# Patient Record
Sex: Female | Born: 1965 | Race: Black or African American | Hispanic: No | Smoking: Never smoker
Health system: Southern US, Community
[De-identification: ages and names within clinical notes are randomized; demographics above are authoritative.]

## PROBLEM LIST (undated history)

## (undated) DIAGNOSIS — B9689 Other specified bacterial agents as the cause of diseases classified elsewhere: Secondary | ICD-10-CM

## (undated) DIAGNOSIS — K5900 Constipation, unspecified: Secondary | ICD-10-CM

## (undated) DIAGNOSIS — N760 Acute vaginitis: Secondary | ICD-10-CM

## (undated) DIAGNOSIS — D219 Benign neoplasm of connective and other soft tissue, unspecified: Secondary | ICD-10-CM

## (undated) DIAGNOSIS — L732 Hidradenitis suppurativa: Secondary | ICD-10-CM

## (undated) DIAGNOSIS — N946 Dysmenorrhea, unspecified: Secondary | ICD-10-CM

## (undated) DIAGNOSIS — K649 Unspecified hemorrhoids: Secondary | ICD-10-CM

## (undated) HISTORY — PX: BREAST BIOPSY: SHX20

## (undated) HISTORY — DX: Unspecified hemorrhoids: K64.9

## (undated) HISTORY — DX: Dysmenorrhea, unspecified: N94.6

## (undated) HISTORY — DX: Benign neoplasm of connective and other soft tissue, unspecified: D21.9

## (undated) HISTORY — DX: Hidradenitis suppurativa: L73.2

## (undated) HISTORY — PX: EXTERNAL EAR SURGERY: SHX627

## (undated) HISTORY — DX: Other specified bacterial agents as the cause of diseases classified elsewhere: N76.0

## (undated) HISTORY — DX: Constipation, unspecified: K59.00

## (undated) HISTORY — DX: Other specified bacterial agents as the cause of diseases classified elsewhere: B96.89

---

## 1999-02-16 HISTORY — PX: TUBAL LIGATION: SHX77

## 2002-02-15 HISTORY — PX: OTHER SURGICAL HISTORY: SHX169

## 2004-12-25 ENCOUNTER — Emergency Department: Payer: Self-pay | Admitting: Emergency Medicine

## 2005-08-25 ENCOUNTER — Ambulatory Visit: Payer: Self-pay | Admitting: Unknown Physician Specialty

## 2006-08-30 ENCOUNTER — Ambulatory Visit: Payer: Self-pay

## 2007-11-08 ENCOUNTER — Ambulatory Visit: Payer: Self-pay

## 2008-11-13 ENCOUNTER — Ambulatory Visit: Payer: Self-pay

## 2010-02-12 ENCOUNTER — Ambulatory Visit: Payer: Self-pay | Admitting: Obstetrics & Gynecology

## 2011-02-16 HISTORY — PX: BREAST CYST ASPIRATION: SHX578

## 2011-03-25 ENCOUNTER — Ambulatory Visit: Payer: Self-pay

## 2012-04-26 ENCOUNTER — Ambulatory Visit: Payer: Self-pay | Admitting: Family Medicine

## 2013-03-28 ENCOUNTER — Encounter: Payer: Self-pay | Admitting: Family Medicine

## 2013-04-15 ENCOUNTER — Encounter: Payer: Self-pay | Admitting: Family Medicine

## 2013-05-02 ENCOUNTER — Ambulatory Visit: Payer: Self-pay | Admitting: Family Medicine

## 2013-11-14 LAB — BASIC METABOLIC PANEL: Glucose: 120 mg/dL

## 2013-11-14 LAB — HEMOGLOBIN A1C: Hgb A1c MFr Bld: 5.8 % (ref 4.0–6.0)

## 2014-04-16 HISTORY — PX: COLONOSCOPY: SHX174

## 2014-04-26 ENCOUNTER — Ambulatory Visit: Payer: Self-pay | Admitting: Gastroenterology

## 2014-05-10 ENCOUNTER — Ambulatory Visit: Payer: Self-pay | Admitting: Nurse Practitioner

## 2014-07-18 DIAGNOSIS — R079 Chest pain, unspecified: Secondary | ICD-10-CM | POA: Insufficient documentation

## 2014-07-18 DIAGNOSIS — R519 Headache, unspecified: Secondary | ICD-10-CM | POA: Insufficient documentation

## 2014-07-18 DIAGNOSIS — E559 Vitamin D deficiency, unspecified: Secondary | ICD-10-CM | POA: Insufficient documentation

## 2014-07-18 DIAGNOSIS — R0789 Other chest pain: Secondary | ICD-10-CM | POA: Insufficient documentation

## 2014-07-18 DIAGNOSIS — R51 Headache: Secondary | ICD-10-CM

## 2014-07-18 DIAGNOSIS — Z1331 Encounter for screening for depression: Secondary | ICD-10-CM | POA: Insufficient documentation

## 2014-07-18 DIAGNOSIS — IMO0002 Reserved for concepts with insufficient information to code with codable children: Secondary | ICD-10-CM | POA: Insufficient documentation

## 2014-07-18 DIAGNOSIS — E669 Obesity, unspecified: Secondary | ICD-10-CM | POA: Insufficient documentation

## 2014-07-18 DIAGNOSIS — R739 Hyperglycemia, unspecified: Secondary | ICD-10-CM | POA: Insufficient documentation

## 2014-08-12 ENCOUNTER — Encounter: Payer: Self-pay | Admitting: Family Medicine

## 2014-08-12 ENCOUNTER — Ambulatory Visit (INDEPENDENT_AMBULATORY_CARE_PROVIDER_SITE_OTHER): Payer: BC Managed Care – PPO | Admitting: Family Medicine

## 2014-08-12 VITALS — BP 120/68 | HR 88 | Temp 98.7°F | Resp 20 | Ht 64.0 in | Wt 204.0 lb

## 2014-08-12 DIAGNOSIS — R739 Hyperglycemia, unspecified: Secondary | ICD-10-CM

## 2014-08-12 LAB — POCT GLYCOSYLATED HEMOGLOBIN (HGB A1C): Hemoglobin A1C: 5.9

## 2014-08-12 NOTE — Progress Notes (Signed)
   Subjective:    Patient ID: Sharon Brock, female    DOB: May 09, 1965, 49 y.o.   MRN: [id number]  Diabetes She presents for her follow-up diabetic visit. Her disease course has been stable. There are no hypoglycemic associated symptoms. Associated symptoms include blurred vision, foot paresthesias (left foot "burning sensation") and visual change. Pertinent negatives for diabetes include no chest pain, no fatigue, no foot ulcerations, no polydipsia, no polyphagia, no polyuria, no weakness and no weight loss. Symptoms are stable. There are no diabetic complications. Current diabetic treatment includes diet. She is compliant with treatment most of the time. Her weight is stable. She participates in exercise three times a week. She does not see a podiatrist.Eye exam is current.   Lab Results  Component Value Date   HGBA1C 5.8 11/14/2013      Review of Systems  Constitutional: Negative for weight loss and fatigue.  Eyes: Positive for blurred vision.  Cardiovascular: Negative for chest pain.  Endocrine: Negative for polydipsia, polyphagia and polyuria.  Neurological: Negative for weakness.   Patient Active Problem List   Diagnosis Date Noted  . Atypical chest pain 07/18/2014  . Adult BMI 30+ 07/18/2014  . Screening for depression 07/18/2014  . Cephalalgia 07/18/2014  . Blood glucose elevated 07/18/2014  . Avitaminosis D 07/18/2014  . Allergic rhinitis 05/25/2008   History reviewed. No pertinent past medical history. Current Outpatient Prescriptions on File Prior to Visit  Medication Sig  . Vitamin D, Cholecalciferol, 1000 UNITS TABS Take 2 tablets by mouth daily.   No current facility-administered medications on file prior to visit.   No Known Allergies Past Surgical History  Procedure Laterality Date  . No past surgeries     History   Social History  . Marital Status: Married    Spouse Name: N/A  . Number of Children: 1  . Years of Education: College    Occupational History  .  [employer]   Social History Main Topics  . Smoking status: Never Smoker   . Smokeless tobacco: Never Used  . Alcohol Use: No  . Drug Use: No  . Sexual Activity: Not on file   Other Topics Concern  . Not on file   Social History Narrative   Family History  Problem Relation Age of Onset  . Diabetes Mellitus II Mother   . Diabetes Mellitus II Father   . Prostate cancer Father   . Hypertension Brother   . Diabetes Brother          Objective:   Physical Exam  Constitutional: She is oriented to person, place, and time. She appears well-developed and well-nourished.  Musculoskeletal:  Brace on left foot.   Neurological: She is alert and oriented to person, place, and time.  Psychiatric: Her behavior is normal. Judgment and thought content normal.   Blood pressure 120/68, pulse 88, temperature 98.7 F (37.1 C), temperature source Oral, resp. rate 20, height 5\' 4"  (1.626 m), weight 204 lb (92.534 kg), last menstrual period 07/19/2014.        Assessment & Plan:   1. Blood glucose elevated Stable. Recheck in  6 months.   - POCT glycosylated hemoglobin (Hb A1C)

## 2014-10-22 ENCOUNTER — Ambulatory Visit: Payer: BC Managed Care – PPO | Admitting: Family Medicine

## 2014-10-25 ENCOUNTER — Ambulatory Visit (INDEPENDENT_AMBULATORY_CARE_PROVIDER_SITE_OTHER): Payer: BC Managed Care – PPO | Admitting: Family Medicine

## 2014-10-25 ENCOUNTER — Encounter: Payer: Self-pay | Admitting: Family Medicine

## 2014-10-25 VITALS — BP 112/76 | HR 92 | Temp 98.2°F | Resp 16 | Ht 63.5 in | Wt 203.0 lb

## 2014-10-25 DIAGNOSIS — R739 Hyperglycemia, unspecified: Secondary | ICD-10-CM | POA: Diagnosis not present

## 2014-10-25 DIAGNOSIS — R202 Paresthesia of skin: Secondary | ICD-10-CM

## 2014-10-25 DIAGNOSIS — G5750 Tarsal tunnel syndrome, unspecified lower limb: Secondary | ICD-10-CM | POA: Diagnosis not present

## 2014-10-25 MED ORDER — MELOXICAM 15 MG PO TABS: 15.0000 mg | ORAL_TABLET | Freq: Every day | ORAL | Status: DC

## 2014-10-25 NOTE — Progress Notes (Signed)
Subjective:    Patient ID: Sharon Brock, female    DOB: [birth year], 49 y.o.   MRN: [id number]  Foot Pain This is a new (Bilateral) problem. The current episode started more than 1 month ago (x 6 months). The problem occurs daily. The problem has been gradually worsening. Pertinent negatives include no abdominal pain, anorexia, arthralgias, change in bowel habit, chest pain, chills, congestion, coughing, diaphoresis, fatigue, fever, headaches, joint swelling, myalgias, nausea, neck pain, numbness, rash, sore throat, swollen glands, urinary symptoms, vertigo, visual change, vomiting or weakness. The symptoms are aggravated by walking (Pt reports the pain is only present when she walks "a long time in flat, flat shoes").  Pt states the pain is a burning sensation, "like I'm walking on coals". Pt has done research, and believes this to be tarsal tunnel syndrome. Has not tried any anti-inflammatories.  Not worse in the bed.   No diabetes.  Comes and goes.     Review of Systems  Constitutional: Negative for fever, chills, diaphoresis and fatigue.  HENT: Negative for congestion and sore throat.   Respiratory: Negative for cough.   Cardiovascular: Negative for chest pain.  Gastrointestinal: Negative for nausea, vomiting, abdominal pain, anorexia and change in bowel habit.  Musculoskeletal: Negative for myalgias, joint swelling, arthralgias and neck pain.  Skin: Negative for rash.  Neurological: Negative for vertigo, weakness, numbness and headaches.   BP 112/76 mmHg  Pulse 92  Temp(Src) 98.2 F (36.8 C) (Oral)  Resp 16  Ht 5' 3.5" (1.613 m)  Wt 203 lb (92.08 kg)  BMI 35.39 kg/m2  LMP 06/24/2014 (Approximate)   Patient Active Problem List   Diagnosis Date Noted  . Atypical chest pain 07/18/2014  . Adult BMI 30+ 07/18/2014  . Screening for depression 07/18/2014  . Cephalalgia 07/18/2014  . Blood glucose elevated 07/18/2014  . Avitaminosis D 07/18/2014  . Allergic rhinitis  05/25/2008   No past medical history on file. Current Outpatient Prescriptions on File Prior to Visit  Medication Sig  . Vitamin D, Cholecalciferol, 1000 UNITS TABS Take 2 tablets by mouth daily.  Marland Kitchen ibuprofen (ADVIL,MOTRIN) 800 MG tablet Take 800 mg by mouth.   No current facility-administered medications on file prior to visit.   No Known Allergies Past Surgical History  Procedure Laterality Date  . No past surgeries     Social History   Social History  . Marital Status: Married    Spouse Name: N/A  . Number of Children: 1  . Years of Education: College   Occupational History  .  [employer]   Social History Main Topics  . Smoking status: Never Smoker   . Smokeless tobacco: Never Used  . Alcohol Use: No  . Drug Use: No  . Sexual Activity: Not on file   Other Topics Concern  . Not on file   Social History Narrative   Family History  Problem Relation Age of Onset  . Diabetes Mellitus II Mother   . Diabetes Mellitus II Father   . Prostate cancer Father   . Hypertension Brother   . Diabetes Brother       Objective:   Physical Exam  Constitutional: She is oriented to person, place, and time. She appears well-developed and well-nourished.  Musculoskeletal: Normal range of motion. She exhibits no edema or tenderness.  Foot exam normal.   Neurological: She is alert and oriented to person, place, and time.  Monofilament normal.   Psychiatric: She has a normal mood and affect.  BP 112/76 mmHg  Pulse 92  Temp(Src) 98.2 F (36.8 C) (Oral)  Resp 16  Ht 5' 3.5" (1.613 m)  Wt 203 lb (92.08 kg)  BMI 35.39 kg/m2  LMP 06/24/2014 (Approximate)       Assessment & Plan:  1. Paresthesia Will refer to Podiatry, try anti-inflammatory, and check labs.  - Ambulatory referral to Podiatry - TSH - Hemoglobin A1c - Vitamin B12  2. Tarsal tunnel syndrome, unspecified laterality Suspect cause of pain. Will refer.  - Ambulatory referral to Podiatry - meloxicam (MOBIC) 15 MG  tablet; Take 1 tablet (15 mg total) by mouth daily.  Dispense: 30 tablet; Refill: 0 - TSH - Vitamin B12  3. Hyperglycemia Will check labs. Follow up for CPE.  Margarita Rana, MD

## 2014-11-05 ENCOUNTER — Telehealth: Payer: Self-pay

## 2014-11-05 LAB — VITAMIN B12: VITAMIN B 12: 598 pg/mL (ref 211–946)

## 2014-11-05 LAB — HEMOGLOBIN A1C
ESTIMATED AVERAGE GLUCOSE: 131 mg/dL
Hgb A1c MFr Bld: 6.2 % — ABNORMAL HIGH (ref 4.8–5.6)

## 2014-11-05 LAB — TSH: TSH: 2.37 u[IU]/mL (ref 0.450–4.500)

## 2014-11-05 NOTE — Telephone Encounter (Signed)
LMTCB 11/05/2014  Thanks,   -Mickel Baas

## 2014-11-05 NOTE — Telephone Encounter (Signed)
-----   Message from Margarita Rana, MD sent at 11/05/2014  7:35 AM EDT ----- Labs stable. Blood sugar in pre-diabetic range.  Average blood sugar is 131.  Needs to eat healthy and exercise and recheck in 4 months. Thanks.

## 2014-11-05 NOTE — Telephone Encounter (Signed)
Advised pt of lab results. Pt verbally acknowledges understanding. Emily Drozdowski, CMA   

## 2014-11-22 ENCOUNTER — Other Ambulatory Visit: Payer: Self-pay | Admitting: Family Medicine

## 2014-11-22 DIAGNOSIS — G5750 Tarsal tunnel syndrome, unspecified lower limb: Secondary | ICD-10-CM | POA: Insufficient documentation

## 2014-11-28 ENCOUNTER — Other Ambulatory Visit: Payer: Self-pay | Admitting: Family Medicine

## 2014-11-28 DIAGNOSIS — G5750 Tarsal tunnel syndrome, unspecified lower limb: Secondary | ICD-10-CM

## 2014-11-28 MED ORDER — MELOXICAM 15 MG PO TABS: 15.0000 mg | ORAL_TABLET | Freq: Every day | ORAL | Status: DC

## 2015-02-06 ENCOUNTER — Ambulatory Visit: Payer: BC Managed Care – PPO | Admitting: Family Medicine

## 2015-02-06 ENCOUNTER — Encounter: Payer: Self-pay | Admitting: Family Medicine

## 2015-02-06 ENCOUNTER — Ambulatory Visit (INDEPENDENT_AMBULATORY_CARE_PROVIDER_SITE_OTHER): Payer: BC Managed Care – PPO | Admitting: Family Medicine

## 2015-02-06 VITALS — BP 112/78 | HR 84 | Temp 97.8°F | Resp 16 | Wt 203.0 lb

## 2015-02-06 DIAGNOSIS — R739 Hyperglycemia, unspecified: Secondary | ICD-10-CM | POA: Diagnosis not present

## 2015-02-06 LAB — POCT GLYCOSYLATED HEMOGLOBIN (HGB A1C)
Est. average glucose Bld gHb Est-mCnc: 123
Hemoglobin A1C: 5.9

## 2015-02-06 NOTE — Progress Notes (Signed)
Subjective:    Patient ID: Sharon Brock, female    DOB: [birth year], 49 y.o.   MRN: [id number]  Hyperglycemia This is a chronic (FU. Last A1C was 11/04/2014 and was 6.2%) problem. Associated symptoms include abdominal pain (due to possible stomach virus), headaches and nausea. Pertinent negatives include no anorexia, arthralgias, change in bowel habit, chest pain, chills, congestion, coughing, diaphoresis, fatigue, fever, neck pain, numbness, urinary symptoms or visual change. She has tried nothing for the symptoms.   Has been cutting back and exercising some.      Review of Systems  Constitutional: Negative for fever, chills, diaphoresis and fatigue.  HENT: Negative for congestion.   Respiratory: Negative for cough.   Cardiovascular: Negative for chest pain.  Gastrointestinal: Positive for nausea and abdominal pain (due to possible stomach virus). Negative for anorexia and change in bowel habit.  Musculoskeletal: Negative for arthralgias and neck pain.  Neurological: Positive for headaches. Negative for numbness.   BP 112/78 mmHg  Pulse 84  Temp(Src) 97.8 F (36.6 C) (Oral)  Resp 16  Wt 203 lb (92.08 kg)   Patient Active Problem List   Diagnosis Date Noted  . Tarsal tunnel syndrome 11/22/2014  . Atypical chest pain 07/18/2014  . Adult BMI 30+ 07/18/2014  . Screening for depression 07/18/2014  . Cephalalgia 07/18/2014  . Blood glucose elevated 07/18/2014  . Avitaminosis D 07/18/2014  . Allergic rhinitis 05/25/2008   No past medical history on file. Current Outpatient Prescriptions on File Prior to Visit  Medication Sig  . meloxicam (MOBIC) 15 MG tablet Take 1 tablet (15 mg total) by mouth daily.  . Multiple Vitamins-Minerals (MULTIVITAMIN ADULT PO) Take by mouth.  . Vitamin D, Cholecalciferol, 1000 UNITS TABS Take 2 tablets by mouth daily.   No current facility-administered medications on file prior to visit.   No Known Allergies Past Surgical History    Procedure Laterality Date  . No past surgeries     Social History   Social History  . Marital Status: Married    Spouse Name: N/A  . Number of Children: 1  . Years of Education: College   Occupational History  .  [employer]   Social History Main Topics  . Smoking status: Never Smoker   . Smokeless tobacco: Never Used  . Alcohol Use: No  . Drug Use: No  . Sexual Activity: Not on file   Other Topics Concern  . Not on file   Social History Narrative   Family History  Problem Relation Age of Onset  . Diabetes Mellitus II Mother   . Diabetes Mellitus II Father   . Prostate cancer Father   . Hypertension Brother   . Diabetes Brother       Objective:   Physical Exam  Constitutional: She is oriented to person, place, and time. She appears well-developed and well-nourished.  Neurological: She is alert and oriented to person, place, and time.  Psychiatric: She has a normal mood and affect. Her behavior is normal. Judgment and thought content normal.    BP 112/78 mmHg  Pulse 84  Temp(Src) 97.8 F (36.6 C) (Oral)  Resp 16  Wt 203 lb (92.08 kg)       Assessment & Plan:  1. Blood glucose elevated Improved. Continue current lifestyle changes and recheck at CPE.  - POCT glycosylated hemoglobin (Hb A1C)   Patient was seen and examined by Jerrell Belfast, MD, and note scribed by Renaldo Fiddler, CMA. I have reviewed the document for accuracy  and completeness and I agree with above. Jerrell Belfast, MD  Margarita Rana, MD

## 2015-03-18 ENCOUNTER — Other Ambulatory Visit: Payer: Self-pay

## 2015-03-18 DIAGNOSIS — G5750 Tarsal tunnel syndrome, unspecified lower limb: Secondary | ICD-10-CM

## 2015-03-18 MED ORDER — MELOXICAM 15 MG PO TABS: 15.0000 mg | ORAL_TABLET | Freq: Every day | ORAL | Status: DC

## 2015-04-16 ENCOUNTER — Encounter: Payer: Self-pay | Admitting: Family Medicine

## 2015-04-16 ENCOUNTER — Ambulatory Visit (INDEPENDENT_AMBULATORY_CARE_PROVIDER_SITE_OTHER): Payer: BC Managed Care – PPO | Admitting: Family Medicine

## 2015-04-16 VITALS — BP 106/70 | HR 64 | Temp 97.8°F | Resp 16 | Ht 63.0 in | Wt 201.0 lb

## 2015-04-16 DIAGNOSIS — Z1239 Encounter for other screening for malignant neoplasm of breast: Secondary | ICD-10-CM

## 2015-04-16 DIAGNOSIS — Z0000 Encounter for general adult medical examination without abnormal findings: Secondary | ICD-10-CM | POA: Diagnosis not present

## 2015-04-16 DIAGNOSIS — R739 Hyperglycemia, unspecified: Secondary | ICD-10-CM | POA: Diagnosis not present

## 2015-04-16 DIAGNOSIS — E559 Vitamin D deficiency, unspecified: Secondary | ICD-10-CM | POA: Diagnosis not present

## 2015-04-16 NOTE — Progress Notes (Signed)
Patient ID: [name], female   DOB: 1965-08-03, 50 y.o.   MRN: [id number]       Patient: Sharon Brock, Female    DOB: 29-Jan-1966, 50 y.o.   MRN: [id number] Visit Date: 04/16/2015  Today's Provider: Margarita Rana, MD   Chief Complaint  Patient presents with  . Annual Exam   Subjective:    Annual physical exam Sharon Brock is a 50 y.o. female who presents today for health maintenance and complete physical. She feels well. She reports exercising walking for 15-20 minutes 2 times a week. She reports she is sleeping well.  2016    Pap-normal per pt (GYN) 05/10/14 Mammogram-BI-RADS 1 04/26/14 Colonoscopy-polyps, hyperplastic, recheck in 10 yrs  Lab Results  Component Value Date   TSH 2.370 11/04/2014   HGBA1C 5.9 02/06/2015   04/06/2013 Lipid-Total 186; tri 89; HDL 46; LDL 122 04/06/2013 Met C-normal 11/14/2013 Vitamin D 24.9  -----------------------------------------------------------------   Review of Systems  Constitutional: Negative.   HENT: Negative.   Eyes: Negative.   Respiratory: Negative.   Cardiovascular: Negative.   Gastrointestinal: Positive for constipation.  Endocrine: Negative.   Genitourinary: Negative.   Musculoskeletal: Negative.   Skin: Negative.   Allergic/Immunologic: Negative.   Neurological: Negative.   Hematological: Negative.   Psychiatric/Behavioral: Negative.     Social History      She  reports that she has never smoked. She has never used smokeless tobacco. She reports that she does not drink alcohol or use illicit drugs.       Social History   Social History  . Marital Status: Married    Spouse Name: N/A  . Number of Children: 1  . Years of Education: College   Occupational History  .  [employer]   Social History Main Topics  . Smoking status: Never Smoker   . Smokeless tobacco: Never Used  . Alcohol Use: No  . Drug Use: No  . Sexual Activity: Not Asked   Other Topics Concern  . None   Social History  Narrative    History reviewed. No pertinent past medical history.   Patient Active Problem List   Diagnosis Date Noted  . Tarsal tunnel syndrome 11/22/2014  . Adult BMI 30+ 07/18/2014  . Screening for depression 07/18/2014  . Cephalalgia 07/18/2014  . Blood glucose elevated 07/18/2014  . Avitaminosis D 07/18/2014  . Adiposity 07/18/2014  . Allergic rhinitis 05/25/2008    Past Surgical History  Procedure Laterality Date  . Tubal ligation  2001  . Hydronitous Bilateral 2004    inner thigh and buttocks    Family History        Family Status  Relation Status Death Age  . Mother Alive   . Father Deceased 35  . Brother Alive   . Brother Alive   . Brother Alive         Her family history includes Diabetes in her brother; Diabetes Mellitus II in her father and mother; Hypertension in her brother; Prostate cancer in her father.    No Known Allergies  Previous Medications   MULTIPLE VITAMINS-MINERALS (MULTIVITAMIN ADULT PO)    Take by mouth.   VITAMIN D, CHOLECALCIFEROL, 1000 UNITS TABS    Take 1 tablet by mouth daily.     Patient Care Team: Margarita Rana, MD as PCP - General (Family Medicine)     Objective:   Vitals: BP 106/70 mmHg  Pulse 64  Temp(Src) 97.8 F (36.6 C) (Oral)  Resp 16  Ht 5'  3" (1.6 m)  Wt 201 lb (91.173 kg)  BMI 35.61 kg/m2  SpO2 98%   Physical Exam  Constitutional: She is oriented to person, place, and time. She appears well-developed and well-nourished.  HENT:  Head: Normocephalic and atraumatic.  Right Ear: Tympanic membrane, external ear and ear canal normal.  Left Ear: Tympanic membrane, external ear and ear canal normal.  Nose: Nose normal.  Mouth/Throat: Uvula is midline, oropharynx is clear and moist and mucous membranes are normal.  Eyes: Conjunctivae, EOM and lids are normal. Pupils are equal, round, and reactive to light.  Neck: Trachea normal and normal range of motion. Neck supple. Carotid bruit is not present. No thyroid mass  and no thyromegaly present.  Cardiovascular: Normal rate, regular rhythm and normal heart sounds.   Pulmonary/Chest: Effort normal and breath sounds normal.  Abdominal: Soft. Normal appearance and bowel sounds are normal. There is no hepatosplenomegaly. There is no tenderness.  Musculoskeletal: Normal range of motion.  Lymphadenopathy:    She has no cervical adenopathy.    She has no axillary adenopathy.  Neurological: She is alert and oriented to person, place, and time. She has normal strength. No cranial nerve deficit.  Skin: Skin is warm, dry and intact.  Psychiatric: She has a normal mood and affect. Her speech is normal and behavior is normal. Judgment and thought content normal. Cognition and memory are normal.     Depression Screen PHQ 2/9 Scores 04/16/2015  PHQ - 2 Score 0      Assessment & Plan:     Routine Health Maintenance and Physical Exam  Exercise Activities and Dietary recommendations Goals    None        1. Annual physical exam Stable. Patient advised to continue eating healthy and exercise daily. Increase exercise. Weight loss to prevent diabetes.    2. Avitaminosis D - VITAMIN D 25 Hydroxy (Vit-D Deficiency, Fractures)  3. Blood glucose elevated - Lipid Panel With LDL/HDL Ratio - Comprehensive metabolic panel     Patient seen and examined by Dr. Jerrell Belfast, and note scribed by Philbert Riser. Dimas, CMA.  I have reviewed the document for accuracy and completeness and I agree with above. Jerrell Belfast, MD   Margarita Rana, MD   --------------------------------------------------------------------

## 2015-04-17 ENCOUNTER — Telehealth: Payer: Self-pay

## 2015-04-17 LAB — COMPREHENSIVE METABOLIC PANEL
A/G RATIO: 1.3 (ref 1.1–2.5)
ALBUMIN: 4.3 g/dL (ref 3.5–5.5)
ALK PHOS: 80 IU/L (ref 39–117)
ALT: 14 IU/L (ref 0–32)
AST: 14 IU/L (ref 0–40)
BUN / CREAT RATIO: 15 (ref 9–23)
BUN: 12 mg/dL (ref 6–24)
Bilirubin Total: 0.4 mg/dL (ref 0.0–1.2)
CO2: 25 mmol/L (ref 18–29)
CREATININE: 0.8 mg/dL (ref 0.57–1.00)
Calcium: 9.3 mg/dL (ref 8.7–10.2)
Chloride: 100 mmol/L (ref 96–106)
GFR calc Af Amer: 100 mL/min/{1.73_m2} (ref >59)
GFR calc non Af Amer: 87 mL/min/{1.73_m2} (ref >59)
GLOBULIN, TOTAL: 3.3 g/dL (ref 1.5–4.5)
Glucose: 100 mg/dL — ABNORMAL HIGH (ref 65–99)
POTASSIUM: 4.6 mmol/L (ref 3.5–5.2)
SODIUM: 140 mmol/L (ref 134–144)
Total Protein: 7.6 g/dL (ref 6.0–8.5)

## 2015-04-17 LAB — LIPID PANEL WITH LDL/HDL RATIO
CHOLESTEROL TOTAL: 180 mg/dL (ref 100–199)
HDL: 49 mg/dL (ref >39)
LDL CALC: 107 mg/dL — AB (ref 0–99)
LDL/HDL RATIO: 2.2 ratio (ref 0.0–3.2)
TRIGLYCERIDES: 121 mg/dL (ref 0–149)
VLDL CHOLESTEROL CAL: 24 mg/dL (ref 5–40)

## 2015-04-17 LAB — VITAMIN D 25 HYDROXY (VIT D DEFICIENCY, FRACTURES): Vit D, 25-Hydroxy: 35 ng/mL (ref 30.0–100.0)

## 2015-04-17 NOTE — Telephone Encounter (Signed)
-----   Message from Margarita Rana, MD sent at 04/17/2015  8:08 AM EST ----- Labs stable. Please notify patient. Thanks.

## 2015-04-17 NOTE — Telephone Encounter (Signed)
Advised pt of lab results. Pt verbally acknowledges understanding. Saaya Procell Drozdowski, CMA   

## 2015-05-30 ENCOUNTER — Ambulatory Visit
Admission: RE | Admit: 2015-05-30 | Discharge: 2015-05-30 | Disposition: A | Discharge status: Home / Self Care | Payer: BC Managed Care – PPO | Source: Ambulatory Visit | Attending: Family Medicine | Admitting: Family Medicine

## 2015-05-30 DIAGNOSIS — Z1231 Encounter for screening mammogram for malignant neoplasm of breast: Secondary | ICD-10-CM | POA: Diagnosis present

## 2015-05-30 DIAGNOSIS — Z1239 Encounter for other screening for malignant neoplasm of breast: Secondary | ICD-10-CM

## 2015-07-21 ENCOUNTER — Encounter: Payer: Self-pay | Admitting: *Deleted

## 2015-07-21 ENCOUNTER — Telehealth: Payer: Self-pay

## 2015-07-21 DIAGNOSIS — K649 Unspecified hemorrhoids: Secondary | ICD-10-CM

## 2015-07-21 NOTE — Telephone Encounter (Signed)
Pt called c/o hematochezia. Has been experiencing bright blood intermittent stools x several months. Last colonoscopy was 04/26/2014. 3 hyperplastic polyps, as well as non-bleeding internal hemorrhoids were found. Per verbal order from Dr. Venia Minks, referred pt to surgery. Canceled appointment for bloody stools. Pt agrees with treatment plan. Renaldo Fiddler, CMA

## 2015-07-30 ENCOUNTER — Ambulatory Visit (INDEPENDENT_AMBULATORY_CARE_PROVIDER_SITE_OTHER): Payer: BC Managed Care – PPO | Admitting: General Surgery

## 2015-07-30 ENCOUNTER — Encounter: Payer: Self-pay | Admitting: General Surgery

## 2015-07-30 VITALS — BP 130/72 | HR 76 | Resp 12 | Ht 66.0 in | Wt 200.0 lb

## 2015-07-30 DIAGNOSIS — K648 Other hemorrhoids: Secondary | ICD-10-CM | POA: Diagnosis not present

## 2015-07-30 DIAGNOSIS — K625 Hemorrhage of anus and rectum: Secondary | ICD-10-CM | POA: Diagnosis not present

## 2015-07-30 LAB — POC HEMOCCULT BLD/STL (OFFICE/1-CARD/DIAGNOSTIC): Fecal Occult Blood, POC: NEGATIVE

## 2015-07-30 NOTE — Patient Instructions (Addendum)
The patient will be encouraged to make use of a daily fiber supplement.Patient to return as needed.

## 2015-07-30 NOTE — Progress Notes (Signed)
Patient ID: Sharon Brock, female   DOB: 10-31-1965, 50 y.o.   MRN: [id number]  Chief Complaint  Patient presents with  . Other    hemorrhoids    HPI Sharon Piccoloamela Lynn Gertsch is a 50 y.o. female here today for a evaluation of hemorrhoids. Patient states she has had them for about two years. She states she had a colonoscopy in 2016 for rectal bleeding. Patient states she has been bleeding off and on for over a year now. Bright red blood on the paper and in the bowl. No pain.  Moves her bowels every three or four days. Stools are typically hard.  I personally reviewed the patient's history. HPI  History reviewed. No pertinent past medical history.  Past Surgical History  Procedure Laterality Date  . Tubal ligation  2001  . Hydronitous Bilateral 2004    inner thigh and buttocks  . Breast cyst aspiration Right 2013    NEG  . Colonoscopy  04/2014    Family History  Problem Relation Age of Onset  . Diabetes Mellitus II Mother   . Diabetes Mellitus II Father   . Prostate cancer Father   . Hypertension Brother   . Diabetes Brother   . Breast cancer Neg Hx     Social History Social History  Substance Use Topics  . Smoking status: Never Smoker   . Smokeless tobacco: Never Used  . Alcohol Use: No    No Known Allergies  Current Outpatient Prescriptions  Medication Sig Dispense Refill  . dicloxacillin (DYNAPEN) 500 MG capsule     . meloxicam (MOBIC) 15 MG tablet Take 15 mg by mouth daily.    . Multiple Vitamins-Minerals (MULTIVITAMIN ADULT PO) Take by mouth.     No current facility-administered medications for this visit.    Review of Systems Review of Systems  Constitutional: Negative.   Respiratory: Negative.   Cardiovascular: Negative.   Gastrointestinal: Positive for constipation.    Blood pressure 130/72, pulse 76, resp. rate 12, height 5\' 6"  (1.676 m), weight 200 lb (90.719 kg).  Physical Exam Physical Exam  Constitutional: She is oriented to person, place,  and time. She appears well-developed and well-nourished.  Eyes: Conjunctivae are normal. No scleral icterus.  Neck: Neck supple.  Cardiovascular: Normal rate, regular rhythm and normal heart sounds.   Pulmonary/Chest: Effort normal and breath sounds normal.  Genitourinary: Rectal exam shows internal hemorrhoid and fissure.  Lymphadenopathy:    She has no cervical adenopathy.  Neurological: She is alert and oriented to person, place, and time.  Skin: Skin is warm and dry.    Data Reviewed Colonoscopy dated 04/26/2014 completed for hematochezia was performed by Dr. Servando SnareWohl. Report revealed perianal and digital rectal exams were normal. Retroflexed view of the anus did show internal hemorrhoids. 3 small polyps of the sigmoid colon. Review of the hospital database had no pathology attached to this procedure.  Anoscopy showed some prominent internal tissue anteriorly on the left us associated with a 5 x 8 mm a sensate squamous nodule likely secondary to chronic prolapse as well as a less prominent internal pile posteriorly.  Assessment    Rectal bleeding secondary to moderate internal hemorrhoids and ongoing constipation.    Plan    The importance of maintaining soft easy illumination was reviewed.   The patient will be encouraged to make use of a daily fiber supplement of choice for the next month with adequate water for effective use.  The patient's been asked to give a phone report  in 1 month. If her symptoms persist we'll reassess for the possibility of hemorrhoid banding..  Patient to return as  needed  PCP:  Venia Minks This information has been scribed by Gaspar Cola CMA.    Robert Bellow 07/31/2015, 4:12 PM

## 2015-07-31 ENCOUNTER — Ambulatory Visit: Payer: BC Managed Care – PPO | Admitting: Family Medicine

## 2015-07-31 ENCOUNTER — Encounter: Payer: Self-pay | Admitting: General Surgery

## 2015-07-31 DIAGNOSIS — K625 Hemorrhage of anus and rectum: Secondary | ICD-10-CM | POA: Insufficient documentation

## 2015-07-31 DIAGNOSIS — K648 Other hemorrhoids: Secondary | ICD-10-CM | POA: Insufficient documentation

## 2015-10-13 ENCOUNTER — Ambulatory Visit: Payer: BC Managed Care – PPO | Admitting: General Surgery

## 2015-11-03 ENCOUNTER — Ambulatory Visit: Payer: BC Managed Care – PPO | Admitting: General Surgery

## 2015-12-01 ENCOUNTER — Ambulatory Visit: Payer: BC Managed Care – PPO | Admitting: General Surgery

## 2016-02-12 ENCOUNTER — Encounter: Payer: Self-pay | Admitting: *Deleted

## 2016-03-02 ENCOUNTER — Encounter: Payer: Self-pay | Admitting: *Deleted

## 2016-03-08 ENCOUNTER — Ambulatory Visit (INDEPENDENT_AMBULATORY_CARE_PROVIDER_SITE_OTHER): Payer: BC Managed Care – PPO | Admitting: General Surgery

## 2016-03-08 ENCOUNTER — Encounter: Payer: Self-pay | Admitting: General Surgery

## 2016-03-08 VITALS — BP 134/66 | HR 68 | Resp 12 | Ht 64.0 in | Wt 199.0 lb

## 2016-03-08 DIAGNOSIS — K648 Other hemorrhoids: Secondary | ICD-10-CM | POA: Insufficient documentation

## 2016-03-08 NOTE — Patient Instructions (Addendum)
Return as needed.Continue fiber supplement.

## 2016-03-08 NOTE — Progress Notes (Signed)
Patient ID: Sharon Brock, female   DOB: [birth year], 51 y.o.   MRN: [id number]  Chief Complaint  Patient presents with  . Hemorrhoids    HPI Sharon Brock is a 51 y.o. female is here today for blood in stool and hemorrhoids. Patient was last seen in June 2017. Sharon Brock states Sharon Brock still has bleeding maybe once a month. Sharon Brock has been taking a fiber supplement which has been helping.   If the patient is going to have an episode of bleeding it is associated with a hard stool no pain with defecation. HPI  No past medical history on file.  Past Surgical History:  Procedure Laterality Date  . BREAST CYST ASPIRATION Right 2013   NEG  . COLONOSCOPY  04/2014  . hydronitous Bilateral 2004   inner thigh and buttocks  . TUBAL LIGATION  2001    Family History  Problem Relation Age of Onset  . Diabetes Mellitus II Mother   . Diabetes Mellitus II Father   . Prostate cancer Father   . Hypertension Brother   . Diabetes Brother   . Breast cancer Neg Hx     Social History Social History  Substance Use Topics  . Smoking status: Never Smoker  . Smokeless tobacco: Never Used  . Alcohol use No    No Known Allergies  Current Outpatient Prescriptions  Medication Sig Dispense Refill  . Corn Dextrin (EQL FIBER SUPPLEMENT PO) Take by mouth.    Marland Kitchen HUMIRA 40 MG/0.8ML PSKT     . meloxicam (MOBIC) 15 MG tablet Take 15 mg by mouth as needed.     . Multiple Vitamins-Minerals (MULTIVITAMIN ADULT PO) Take by mouth.     No current facility-administered medications for this visit.     Review of Systems Review of Systems  Constitutional: Negative.   Respiratory: Negative.   Cardiovascular: Negative.   Gastrointestinal: Negative.     Blood pressure 134/66, pulse 68, resp. rate 12, height 5\' 4"  (1.626 m), weight 199 lb (90.3 kg).  Physical Exam Physical Exam  Constitutional: Sharon Brock is oriented to person, place, and time. Sharon Brock appears well-developed and well-nourished.  Eyes: Conjunctivae are  normal. No scleral icterus.  Cardiovascular: Normal rate, regular rhythm and normal heart sounds.   Genitourinary:  Genitourinary Comments: Evidence of focal hidradenitis approximately 8 cm from the anus to the right of the midline. Extensive scarring from previous wide excision in the early 2000 on the left side of the midline.  Digital rectal exam undertaken without discomfort. No masses or tenderness. No fissure or fistula.  Neurological: Sharon Brock is alert and oriented to person, place, and time.  Skin: Skin is warm and dry.    Data Reviewed No new data.  Assessment    Episodic rectal bleeding thought secondary to internal hemorrhoids, markedly improved with fiber supplements.    Plan    Patient will follow-up as needed. Should Sharon Brock develop increasing bleeding or develop pain, early reassessment we'll be appropriate.  Past efforts to retrieve biopsy results from her October 2016 colonoscopy have been unsuccessful. The endoscopist reported to-4 mm sessile polyps in the sigmoid. Repeat exam in 5 years recommended.    Return as needed.Continue fiber supplement.  This information has been scribed by Verlene Mayer, CMA.   Robert Bellow 03/08/2016, 12:51 PM

## 2016-05-25 ENCOUNTER — Encounter: Payer: Self-pay | Admitting: Obstetrics and Gynecology

## 2016-05-25 ENCOUNTER — Ambulatory Visit (INDEPENDENT_AMBULATORY_CARE_PROVIDER_SITE_OTHER): Payer: BC Managed Care – PPO | Admitting: Obstetrics and Gynecology

## 2016-05-25 VITALS — BP 116/84 | HR 77 | Ht 64.0 in | Wt 208.0 lb

## 2016-05-25 DIAGNOSIS — B373 Candidiasis of vulva and vagina: Secondary | ICD-10-CM | POA: Diagnosis not present

## 2016-05-25 DIAGNOSIS — Z1231 Encounter for screening mammogram for malignant neoplasm of breast: Secondary | ICD-10-CM

## 2016-05-25 DIAGNOSIS — B3731 Acute candidiasis of vulva and vagina: Secondary | ICD-10-CM

## 2016-05-25 DIAGNOSIS — Z1151 Encounter for screening for human papillomavirus (HPV): Secondary | ICD-10-CM

## 2016-05-25 DIAGNOSIS — Z01419 Encounter for gynecological examination (general) (routine) without abnormal findings: Secondary | ICD-10-CM

## 2016-05-25 DIAGNOSIS — N951 Menopausal and female climacteric states: Secondary | ICD-10-CM | POA: Diagnosis not present

## 2016-05-25 DIAGNOSIS — L732 Hidradenitis suppurativa: Secondary | ICD-10-CM | POA: Insufficient documentation

## 2016-05-25 DIAGNOSIS — Z1239 Encounter for other screening for malignant neoplasm of breast: Secondary | ICD-10-CM

## 2016-05-25 DIAGNOSIS — Z124 Encounter for screening for malignant neoplasm of cervix: Secondary | ICD-10-CM

## 2016-05-25 LAB — POCT WET PREP WITH KOH
Clue Cells Wet Prep HPF POC: NEGATIVE
KOH PREP POC: NEGATIVE
Trichomonas, UA: NEGATIVE
YEAST WET PREP PER HPF POC: POSITIVE

## 2016-05-25 LAB — HM PAP SMEAR

## 2016-05-25 MED ORDER — TERCONAZOLE 0.8 % VA CREA: 1.0000 | TOPICAL_CREAM | Freq: Every day | VAGINAL | 0 refills | Status: AC

## 2016-05-25 NOTE — Progress Notes (Signed)
Chief Complaint  Patient presents with  . Gynecologic Exam    discharge x few days    HPI:      [name] is a 51 y.o. No obstetric history on file. who LMP was No LMP recorded. Patient is not currently having periods (Reason: Perimenopausal)., presents today for her annual examination.  Her menses are absent but she had 5 days of light bleeding a few months ago.  Dysmenorrhea none. She does not have intermenstrual bleeding.  She complains of increased vaginal d/c, irritation, without odor for the past few days. She was on abx about a month ago.  She does not have vasomotor sx.  Sex activity: single partner, contraception - tubal ligation. She does not have vaginal dryness.  Last Pap: April 03, 2014  Results were: no abnormalities /neg HPV DNA done 1/13.  Hx of STDs: none  Last mammogram: May 30, 2015  Results were: normal--routine follow-up in 12 months There is no FH of breast cancer. There is no FH of ovarian cancer. The patient does do self-breast exams.  Colonoscopy: colonoscopy 2 years ago with polyps; repeat due (per pt) in 5-10 yrs.   Tobacco use: The patient denies current or previous tobacco use. Alcohol use: none Exercise: moderately active  She does get adequate calcium and Vitamin D in her diet.   History reviewed. No pertinent past medical history.  Past Surgical History:  Procedure Laterality Date  . BREAST CYST ASPIRATION Right 2013   NEG  . COLONOSCOPY  04/2014  . hydronitous Bilateral 2004   inner thigh and buttocks  . TUBAL LIGATION  2001    Family History  Problem Relation Age of Onset  . Diabetes Mellitus II Mother   . Diabetes Mellitus II Father   . Prostate cancer Father   . Hypertension Brother   . Diabetes Brother   . Breast cancer Neg Hx     Social History   Social History  . Marital status: Married    Spouse name: N/A  . Number of children: 1  . Years of education: College   Occupational History  .  [employer]    Social History Main Topics  . Smoking status: Never Smoker  . Smokeless tobacco: Never Used  . Alcohol use No  . Drug use: No  . Sexual activity: Yes   Other Topics Concern  . Not on file   Social History Narrative  . No narrative on file     Current Outpatient Prescriptions:  .  Corn Dextrin (EQL FIBER SUPPLEMENT PO), Take by mouth., Disp: , Rfl:  .  HUMIRA 40 MG/0.8ML PSKT, , Disp: , Rfl:  .  meloxicam (MOBIC) 15 MG tablet, Take 15 mg by mouth as needed. , Disp: , Rfl:  .  Multiple Vitamins-Minerals (MULTIVITAMIN ADULT PO), Take by mouth., Disp: , Rfl:  .  terconazole (TERAZOL 3) 0.8 % vaginal cream, Place 1 applicator vaginally at bedtime., Disp: 20 g, Rfl: 0 .  triamcinolone ointment (KENALOG) 0.1 %, , Disp: , Rfl:    ROS:  Review of Systems  Constitutional: Negative for fever, malaise/fatigue and weight loss.  HENT: Negative for congestion, ear pain and sinus pain.   Respiratory: Negative for cough, shortness of breath and wheezing.   Cardiovascular: Negative for chest pain, orthopnea and leg swelling.  Gastrointestinal: Negative for constipation, diarrhea, nausea and vomiting.  Genitourinary: Negative for dysuria, frequency, hematuria and urgency.       Breast ROS: negative  Has Increased  d/c   Musculoskeletal: Negative for back pain, joint pain and myalgias.  Skin: Negative for itching and rash.  Neurological: Negative for dizziness, tingling, focal weakness and headaches.  Endo/Heme/Allergies: Negative for environmental allergies. Does not bruise/bleed easily.  Psychiatric/Behavioral: Negative for depression and suicidal ideas. The patient is not nervous/anxious and does not have insomnia.     Objective: BP 116/84 (BP Location: Left Arm, Patient Position: Sitting, Cuff Size: Normal)   Pulse 77   Ht 5\' 4"  (1.626 m)   Wt 208 lb (94.3 kg)   BMI 35.70 kg/m    Physical Exam  Constitutional: She is oriented to person, place, and time. She appears  well-developed and well-nourished.  Genitourinary: Uterus normal. No erythema or tenderness in the vagina. Vaginal discharge found. Right adnexum does not display mass and does not display tenderness. Left adnexum does not display mass and does not display tenderness. Cervix does not exhibit motion tenderness or polyp. Uterus is not enlarged or tender.  Neck: Normal range of motion. No thyromegaly present.  Cardiovascular: Normal rate, regular rhythm and normal heart sounds.   No murmur heard. Pulmonary/Chest: Effort normal and breath sounds normal. Right breast exhibits no mass, no nipple discharge, no skin change and no tenderness. Left breast exhibits no mass, no nipple discharge, no skin change and no tenderness.  Abdominal: Soft. There is no tenderness. There is no guarding.  Musculoskeletal: Normal range of motion.  Neurological: She is alert and oriented to person, place, and time. No cranial nerve deficit.  Psychiatric: She has a normal mood and affect. Her behavior is normal.  Vitals reviewed.   Results: Results for orders placed or performed in visit on 05/25/16 (from the past 24 hour(s))  POCT Wet Prep with KOH     Status: Abnormal   Collection Time: 05/25/16  3:40 PM  Result Value Ref Range   Trichomonas, UA Negative    Clue Cells Wet Prep HPF POC neg    Epithelial Wet Prep HPF POC  Few, Moderate, Many, Too numerous to count   Yeast Wet Prep HPF POC pos    Bacteria Wet Prep HPF POC  Few   RBC Wet Prep HPF POC     WBC Wet Prep HPF POC     KOH Prep POC Negative Negative    Assessment/Plan: Encounter for annual routine gynecological examination  Cervical cancer screening - Plan: IGP, Aptima HPV  Screening for HPV (human papillomavirus) - Plan: IGP, Aptima HPV  Screening for breast cancer - Pt to sched mammo. - Plan: MM DIGITAL SCREENING BILATERAL  Candidal vaginitis - Pos wet prep. Rx terazol eRxd. F/u prn. - Plan: POCT Wet Prep with KOH, terconazole (TERAZOL 3) 0.8 %  vaginal cream  Perimenopause - F/u prn DUB.            GYN counsel mammography screening, adequate intake of calcium and vitamin D, diet and exercise     F/U  Return in about 1 year (around 05/25/2017).  Kenith Trickel B. Lorielle Boehning, PA-C 05/25/2016 4:22 PM

## 2016-05-27 LAB — IGP, APTIMA HPV
HPV Aptima: NEGATIVE
PAP Smear Comment: 0

## 2016-06-07 ENCOUNTER — Ambulatory Visit (INDEPENDENT_AMBULATORY_CARE_PROVIDER_SITE_OTHER): Payer: BC Managed Care – PPO | Admitting: Family Medicine

## 2016-06-07 ENCOUNTER — Encounter: Payer: Self-pay | Admitting: Family Medicine

## 2016-06-07 VITALS — BP 116/72 | HR 80 | Temp 97.9°F | Resp 16 | Wt 202.0 lb

## 2016-06-07 DIAGNOSIS — E6609 Other obesity due to excess calories: Secondary | ICD-10-CM | POA: Diagnosis not present

## 2016-06-07 DIAGNOSIS — R739 Hyperglycemia, unspecified: Secondary | ICD-10-CM | POA: Diagnosis not present

## 2016-06-07 DIAGNOSIS — Z6834 Body mass index (BMI) 34.0-34.9, adult: Secondary | ICD-10-CM

## 2016-06-07 DIAGNOSIS — R52 Pain, unspecified: Secondary | ICD-10-CM | POA: Diagnosis not present

## 2016-06-07 DIAGNOSIS — E7800 Pure hypercholesterolemia, unspecified: Secondary | ICD-10-CM | POA: Diagnosis not present

## 2016-06-07 DIAGNOSIS — D1723 Benign lipomatous neoplasm of skin and subcutaneous tissue of right leg: Secondary | ICD-10-CM

## 2016-06-07 DIAGNOSIS — L732 Hidradenitis suppurativa: Secondary | ICD-10-CM

## 2016-06-07 LAB — POCT GLYCOSYLATED HEMOGLOBIN (HGB A1C)
ESTIMATED AVERAGE GLUCOSE: 123
Hemoglobin A1C: 5.9

## 2016-06-07 NOTE — Progress Notes (Signed)
Patient: Sharon Brock Female    DOB: 04-23-1965   51 y.o.   MRN: [id number] Visit Date: 06/07/2016  Today's Provider: Wilhemena Durie, MD   Chief Complaint  Patient presents with  . Hyperglycemia   Subjective:    HPI      Hyperglycemia, Follow-up:   Lab Results  Component Value Date   HGBA1C 5.9 06/07/2016   HGBA1C 5.9 02/06/2015   HGBA1C 6.2 (H) 11/04/2014   GLUCOSE 100 (H) 04/16/2015    Last seen for for this over 1 year ago.  Management since then includes continuing healthy lifestyle. Current symptoms include polyuria and have been stable.  Weight trend: stable Prior visit with dietician: no Current diet: in general, an "unhealthy" diet Current exercise: walking. Pt walks around the track about 3 days per week.  Pertinent Labs:    Component Value Date/Time   CHOL 180 04/16/2015 1024   TRIG 121 04/16/2015 1024   CREATININE 0.80 04/16/2015 1024    Wt Readings from Last 3 Encounters:  06/07/16 202 lb (91.6 kg)  05/25/16 208 lb (94.3 kg)  03/08/16 199 lb (90.3 kg)   Pt is also concerned about pains she gets in different parts of her body. Pain is described as a "shock", and has been located in her abdomen and breast. The pain only lasts for 1-2 seconds, and occur once or twice weekly.  Pt has a possible cyst located on her right inner thigh.  No Known Allergies   Current Outpatient Prescriptions:  .  Corn Dextrin (EQL FIBER SUPPLEMENT PO), Take by mouth., Disp: , Rfl:  .  finasteride (PROSCAR) 5 MG tablet, Take 5 mg by mouth daily., Disp: , Rfl:  .  HUMIRA 40 MG/0.8ML PSKT, , Disp: , Rfl:  .  meloxicam (MOBIC) 15 MG tablet, Take 15 mg by mouth as needed. , Disp: , Rfl:  .  Multiple Vitamins-Minerals (MULTIVITAMIN ADULT PO), Take by mouth., Disp: , Rfl:  .  triamcinolone ointment (KENALOG) 0.1 %, , Disp: , Rfl:   Review of Systems  Constitutional: Negative for activity change, appetite change, chills, diaphoresis, fatigue, fever and  unexpected weight change.  Eyes: Negative.   Respiratory: Negative for shortness of breath.   Cardiovascular: Negative for chest pain, palpitations and leg swelling.  Endocrine: Positive for polyuria. Negative for polydipsia.  Allergic/Immunologic: Negative.   Neurological: Negative.   Hematological: Negative.   Psychiatric/Behavioral: Negative.     Social History  Substance Use Topics  . Smoking status: Never Smoker  . Smokeless tobacco: Never Used  . Alcohol use 0.6 oz/week    1 Glasses of wine per week   Objective:   BP 116/72 (BP Location: Right Arm, Patient Position: Sitting, Cuff Size: Normal)   Pulse 80   Temp 97.9 F (36.6 C) (Oral)   Resp 16   Wt 202 lb (91.6 kg)   BMI 34.67 kg/m  Vitals:   06/07/16 1603  BP: 116/72  Pulse: 80  Resp: 16  Temp: 97.9 F (36.6 C)  TempSrc: Oral  Weight: 202 lb (91.6 kg)     Physical Exam  Constitutional: She appears well-developed and well-nourished.  HENT:  Head: Normocephalic and atraumatic.  Right Ear: External ear normal.  Left Ear: External ear normal.  Nose: Nose normal.  Eyes: Conjunctivae are normal.  Neck: Normal range of motion. Neck supple. No thyromegaly present.  Cardiovascular: Normal rate, regular rhythm and normal heart sounds.   Pulmonary/Chest: Effort normal and breath  sounds normal. No respiratory distress.  Abdominal: Soft. There is no tenderness.  Musculoskeletal:  2 in by 1 in lipoma located on right inner thigh  Skin: Skin is warm and dry.  Psychiatric: She has a normal mood and affect. Her behavior is normal. Judgment and thought content normal.        Assessment & Plan:     1. Blood glucose elevated Unchanged. Work on diet. Recheck 6 months. - POCT glycosylated hemoglobin (Hb A1C) - CBC with Differential/Platelet - Comprehensive metabolic panel Results for orders placed or performed in visit on 06/07/16  POCT glycosylated hemoglobin (Hb A1C)  Result Value Ref Range   Hemoglobin A1C  5.9    Est. average glucose Bld gHb Est-mCnc 123      2. Hidradenitis suppurativa F/B dermatology. Is taking Humira and Proscar for this. Continue to FU as scheduled.  3. Class 1 obesity due to excess calories with body mass index (BMI) of 34.0 to 34.9 in adult, unspecified whether serious comorbidity present Discussed importance of healthy diet.  4. Lipoma of right lower extremity New problem.   5. Electric shock-type pain Unclear etiology. Will check routine labs. No ongoing symptoms--w/u as indicated . No apparent pathology. 6. Elevated LDL cholesterol level Pt has H/O this. FU pending results. - Lipid panel - TSH     Patient seen and examined by Miguel Aschoff, MD, and note scribed by Renaldo Fiddler, CMA. .I have done the exam and reviewed the above chart and it is accurate to the best of my knowledge. Development worker, community has been used in this note in any air is in the dictation or transcription are unintentional. .lgi  Wilhemena Durie, MD  [place]

## 2016-06-10 ENCOUNTER — Encounter: Payer: Self-pay | Admitting: Family Medicine

## 2016-06-10 LAB — BASIC METABOLIC PANEL
BUN: 9 mg/dL (ref 4–21)
CREATININE: 0.8 mg/dL (ref 0.5–1.1)
Glucose: 96 mg/dL
POTASSIUM: 4.4 mmol/L (ref 3.4–5.3)
Sodium: 143 mmol/L (ref 137–147)

## 2016-06-10 LAB — CBC AND DIFFERENTIAL
HEMATOCRIT: 44 % (ref 36–46)
HEMOGLOBIN: 14.3 g/dL (ref 12.0–16.0)
Neutrophils Absolute: 3 /uL
PLATELETS: 298 10*3/uL (ref 150–399)
WBC: 5.9 10*3/mL

## 2016-06-10 LAB — TSH: TSH: 2.22 u[IU]/mL (ref 0.41–5.90)

## 2016-06-10 LAB — LIPID PANEL
CHOLESTEROL: 167 mg/dL (ref 0–200)
HDL: 48 mg/dL (ref 35–70)
LDL Cholesterol: 105 mg/dL
TRIGLYCERIDES: 71 mg/dL (ref 40–160)

## 2016-06-11 ENCOUNTER — Encounter: Payer: Self-pay | Admitting: Family Medicine

## 2016-06-15 ENCOUNTER — Ambulatory Visit
Admission: RE | Admit: 2016-06-15 | Discharge: 2016-06-15 | Disposition: A | Discharge status: Home / Self Care | Payer: BC Managed Care – PPO | Source: Ambulatory Visit | Attending: Obstetrics and Gynecology | Admitting: Obstetrics and Gynecology

## 2016-06-15 DIAGNOSIS — Z1231 Encounter for screening mammogram for malignant neoplasm of breast: Secondary | ICD-10-CM | POA: Insufficient documentation

## 2016-06-15 DIAGNOSIS — Z1239 Encounter for other screening for malignant neoplasm of breast: Secondary | ICD-10-CM

## 2016-06-16 ENCOUNTER — Encounter: Payer: Self-pay | Admitting: Obstetrics and Gynecology

## 2016-08-30 ENCOUNTER — Telehealth: Payer: Self-pay | Admitting: Family Medicine

## 2016-08-30 NOTE — Telephone Encounter (Signed)
Faxed blood work to [facility] 08-04-16

## 2016-12-15 ENCOUNTER — Ambulatory Visit: Payer: BC Managed Care – PPO | Admitting: Family Medicine

## 2017-01-19 ENCOUNTER — Ambulatory Visit: Payer: BC Managed Care – PPO | Admitting: Family Medicine

## 2017-02-09 ENCOUNTER — Encounter: Payer: Self-pay | Admitting: Family Medicine

## 2017-02-09 ENCOUNTER — Ambulatory Visit (INDEPENDENT_AMBULATORY_CARE_PROVIDER_SITE_OTHER): Payer: BC Managed Care – PPO | Admitting: Family Medicine

## 2017-02-09 VITALS — BP 118/68 | HR 68 | Temp 97.8°F | Resp 14 | Wt 208.0 lb

## 2017-02-09 DIAGNOSIS — R739 Hyperglycemia, unspecified: Secondary | ICD-10-CM

## 2017-02-09 LAB — POCT GLYCOSYLATED HEMOGLOBIN (HGB A1C): HEMOGLOBIN A1C: 6.1

## 2017-02-09 NOTE — Progress Notes (Signed)
       Patient: Sharon Brock Female    DOB: [birth year]   51 y.o.   MRN: [id number] Visit Date: 02/09/2017  Today's Provider: Wilhemena Durie, MD   Chief Complaint  Patient presents with  . Follow-up   Subjective:    HPI Pt is here for a 6 month follow up. She reports that she has been feeling well. Her last A1c was on 06/07/16 and was 5.9 Her Mother just died a few weeks ago so holidays have been difficult.      No Known Allergies   Current Outpatient Medications:  .  Corn Dextrin (EQL FIBER SUPPLEMENT PO), Take by mouth., Disp: , Rfl:  .  HUMIRA 40 MG/0.8ML PSKT, , Disp: , Rfl:  .  Multiple Vitamins-Minerals (MULTIVITAMIN ADULT PO), Take by mouth., Disp: , Rfl:  .  finasteride (PROSCAR) 5 MG tablet, Take 5 mg by mouth daily., Disp: , Rfl:  .  meloxicam (MOBIC) 15 MG tablet, Take 15 mg by mouth as needed. , Disp: , Rfl:  .  triamcinolone ointment (KENALOG) 0.1 %, , Disp: , Rfl:   Review of Systems  Constitutional: Negative.   HENT: Negative.   Eyes: Negative.   Respiratory: Negative.   Cardiovascular: Negative.   Gastrointestinal: Negative.   Endocrine: Negative.   Genitourinary: Negative.   Musculoskeletal: Negative.   Skin: Negative.   Allergic/Immunologic: Negative.   Neurological: Negative.   Hematological: Negative.   Psychiatric/Behavioral: Negative.     Social History   Tobacco Use  . Smoking status: Never Smoker  . Smokeless tobacco: Never Used  Substance Use Topics  . Alcohol use: Yes    Alcohol/week: 0.6 oz    Types: 1 Glasses of wine per week   Objective:   BP 118/68 (BP Location: Left Arm, Patient Position: Sitting, Cuff Size: Normal)   Pulse 68   Temp 97.8 F (36.6 C) (Oral)   Resp 14   Wt 208 lb (94.3 kg)   LMP 07/19/2014 (Exact Date)   BMI 35.70 kg/m  Vitals:   02/09/17 0836  BP: 118/68  Pulse: 68  Resp: 14  Temp: 97.8 F (36.6 C)  TempSrc: Oral  Weight: 208 lb (94.3 kg)     Physical Exam  Constitutional: She is  oriented to person, place, and time. She appears well-developed and well-nourished.  HENT:  Head: Normocephalic and atraumatic.  Right Ear: External ear normal.  Left Ear: External ear normal.  Nose: Nose normal.  Eyes: Conjunctivae are normal. No scleral icterus.  Neck: No thyromegaly present.  Cardiovascular: Normal rate, regular rhythm and normal heart sounds.  Pulmonary/Chest: Effort normal.  Abdominal: Soft.  Neurological: She is alert and oriented to person, place, and time.  Skin: Skin is warm and dry.  Psychiatric: She has a normal mood and affect. Her behavior is normal. Judgment and thought content normal.        Assessment & Plan:     1. Blood glucose elevated  - POCT HgB A1C--6.1 today 2.Mild Obesity Weight loss discussed. RTC 6 months.     I have done the exam and reviewed the above chart and it is accurate to the best of my knowledge. Development worker, community has been used in this note in any air is in the dictation or transcription are unintentional.  Wilhemena Durie, MD  [facility]

## 2017-03-08 ENCOUNTER — Encounter: Payer: Self-pay | Admitting: Obstetrics and Gynecology

## 2017-03-08 ENCOUNTER — Ambulatory Visit: Payer: BC Managed Care – PPO | Admitting: Obstetrics and Gynecology

## 2017-03-30 ENCOUNTER — Encounter: Payer: Self-pay | Admitting: Family Medicine

## 2017-03-30 ENCOUNTER — Ambulatory Visit: Payer: BC Managed Care – PPO | Admitting: Family Medicine

## 2017-03-30 VITALS — BP 108/60 | HR 94 | Temp 98.9°F | Resp 18 | Wt 204.0 lb

## 2017-03-30 DIAGNOSIS — R05 Cough: Secondary | ICD-10-CM

## 2017-03-30 DIAGNOSIS — J069 Acute upper respiratory infection, unspecified: Secondary | ICD-10-CM | POA: Diagnosis not present

## 2017-03-30 DIAGNOSIS — R059 Cough, unspecified: Secondary | ICD-10-CM

## 2017-03-30 MED ORDER — HYDROCOD POLST-CPM POLST ER 10-8 MG/5ML PO SUER: 5.0000 mL | Freq: Every evening | ORAL | 0 refills | Status: DC | PRN

## 2017-03-30 NOTE — Progress Notes (Signed)
Patient: Sharon Brock Female    DOB: 1965-12-16   52 y.o.   MRN: [id number] Visit Date: 03/30/2017  Today's Provider: Wilhemena Durie, MD   Chief Complaint  Patient presents with  . Cough   Subjective:    HPI Pt is here today for a non productive cough that has been going on for about 3 weeks now. She reports that she is feeling better today and she started not to come in but decided it may be a good idea. 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 She denies fevers, chest pain, shortness of breath. She reports that she just wanted to come in to make sure she was getting better.      Allergies  Allergen Reactions  . Aleve [Naproxen Sodium]      Current Outpatient Medications:  .  Corn Dextrin (EQL FIBER SUPPLEMENT PO), Take by mouth., Disp: , Rfl:  .  finasteride (PROSCAR) 5 MG tablet, Take 5 mg by mouth daily., Disp: , Rfl:  .  HUMIRA 40 MG/0.8ML PSKT, , Disp: , Rfl:  .  Multiple Vitamins-Minerals (MULTIVITAMIN ADULT PO), Take by mouth., Disp: , Rfl:  .  meloxicam (MOBIC) 15 MG tablet, Take 15 mg by mouth as needed. , Disp: , Rfl:  .  triamcinolone ointment (KENALOG) 0.1 %, , Disp: , Rfl:   Review of Systems  Constitutional: Negative.   HENT: Positive for congestion, postnasal drip and rhinorrhea.   Eyes: Negative.   Respiratory: Positive for cough.   Cardiovascular: Negative.   Gastrointestinal: Negative.   Endocrine: Negative.   Genitourinary: Negative.   Musculoskeletal: Negative.   Skin: Negative.   Allergic/Immunologic: Negative.   Neurological: Negative.   Hematological: Negative.   Psychiatric/Behavioral: Negative.     Social History   Tobacco Use  . Smoking status: Never Smoker  . Smokeless tobacco: Never Used  Substance Use Topics  . Alcohol use:  Yes    Alcohol/week: 0.6 oz    Types: 1 Glasses of wine per week   Objective:   BP 108/60 (BP Location: Right Arm, Patient Position: Sitting, Cuff Size: Large)   Pulse 94   Temp 98.9 F (37.2 C) (Oral)   Resp 18   Wt 204 lb (92.5 kg)   LMP 07/19/2014 (Exact Date)   SpO2 98%   BMI 35.02 kg/m  Vitals:   03/30/17 1452  BP: 108/60  Pulse: 94  Resp: 18  Temp: 98.9 F (37.2 C)  TempSrc: Oral  SpO2: 98%  Weight: 204 lb (92.5 kg)     Physical Exam  Constitutional: She is oriented to person, place, and time. She appears well-developed and well-nourished.  HENT:  Head: Normocephalic and atraumatic.  Eyes: Conjunctivae are normal.  Neck: No thyromegaly present.  Cardiovascular: Normal rate, regular rhythm and normal heart sounds.  Pulmonary/Chest: Effort normal.  Abdominal: Soft.  Neurological: She is alert and oriented to person, place, and time.  Skin: Skin is warm and dry.  Psychiatric: She has a normal mood and affect. Her behavior is normal. Judgment and thought content normal.        Assessment & Plan:     1. Cough  - chlorpheniramine-HYDROcodone (TUSSIONEX PENNKINETIC ER) 10-8 MG/5ML SUER; Take 5 mLs by mouth  at bedtime as needed for cough.  Dispense: 140 mL; Refill: 0 2.URI Resolving.    I have done the exam and reviewed the chart and it is accurate to the best of my knowledge. Development worker, community has been used and  any errors in dictation or transcription are unintentional. Miguel Aschoff M.D. [place], MD  [facility]

## 2017-05-26 ENCOUNTER — Ambulatory Visit: Payer: BC Managed Care – PPO | Admitting: Obstetrics and Gynecology

## 2017-07-27 ENCOUNTER — Other Ambulatory Visit: Payer: Self-pay | Admitting: Family

## 2017-07-27 DIAGNOSIS — Z1231 Encounter for screening mammogram for malignant neoplasm of breast: Secondary | ICD-10-CM

## 2017-08-10 ENCOUNTER — Encounter: Payer: Self-pay | Admitting: Family Medicine

## 2017-09-20 ENCOUNTER — Ambulatory Visit
Admission: RE | Admit: 2017-09-20 | Discharge: 2017-09-20 | Disposition: A | Discharge status: Home / Self Care | Payer: BC Managed Care – PPO | Source: Ambulatory Visit | Attending: Family | Admitting: Family

## 2017-09-20 DIAGNOSIS — Z1231 Encounter for screening mammogram for malignant neoplasm of breast: Secondary | ICD-10-CM | POA: Diagnosis present

## 2018-04-21 IMAGING — MG MM DIGITAL SCREENING BILAT W/ TOMO W/ CAD
8 of 12 series · 8 of 28 positions shown · non-contrast
Comparison: Previous exam(s).

CLINICAL DATA: Screening.

EXAM:
2D DIGITAL SCREENING BILATERAL MAMMOGRAM WITH CAD AND ADJUNCT TOMO

[L MLO synth-2D]
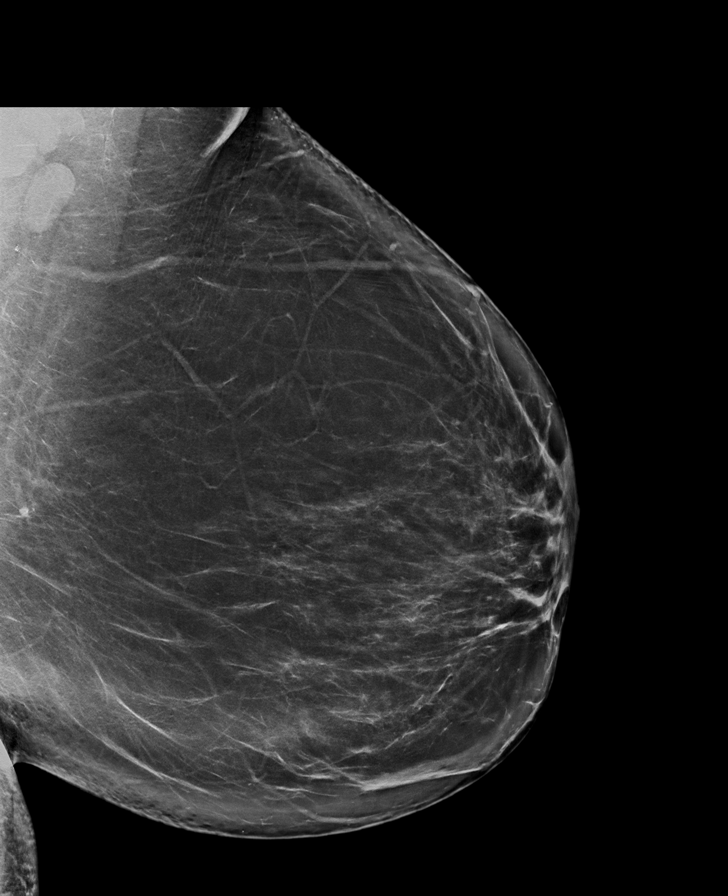

[R MLO]
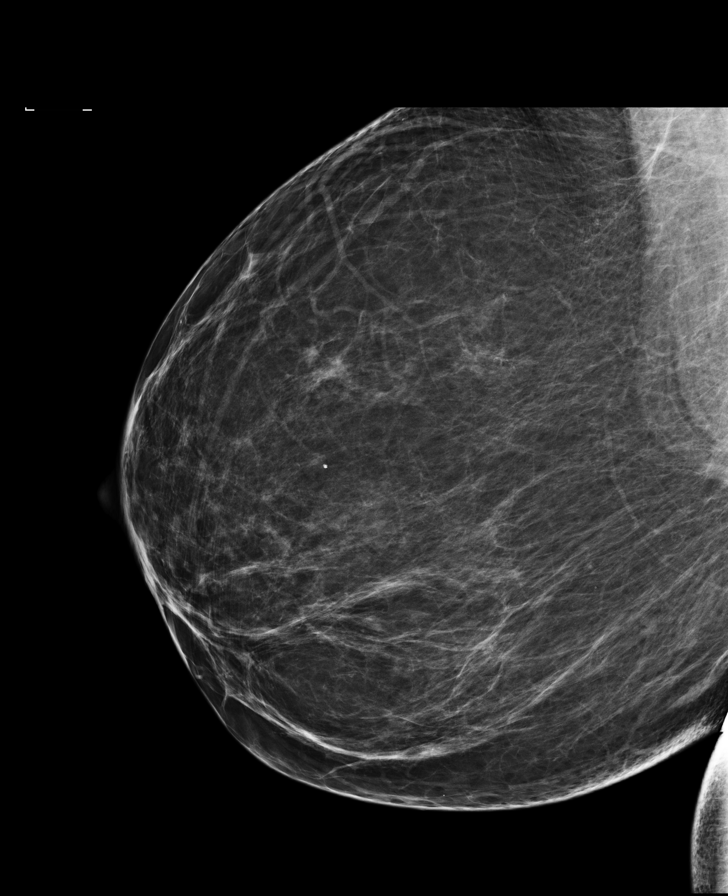

[R MLO synth-2D]
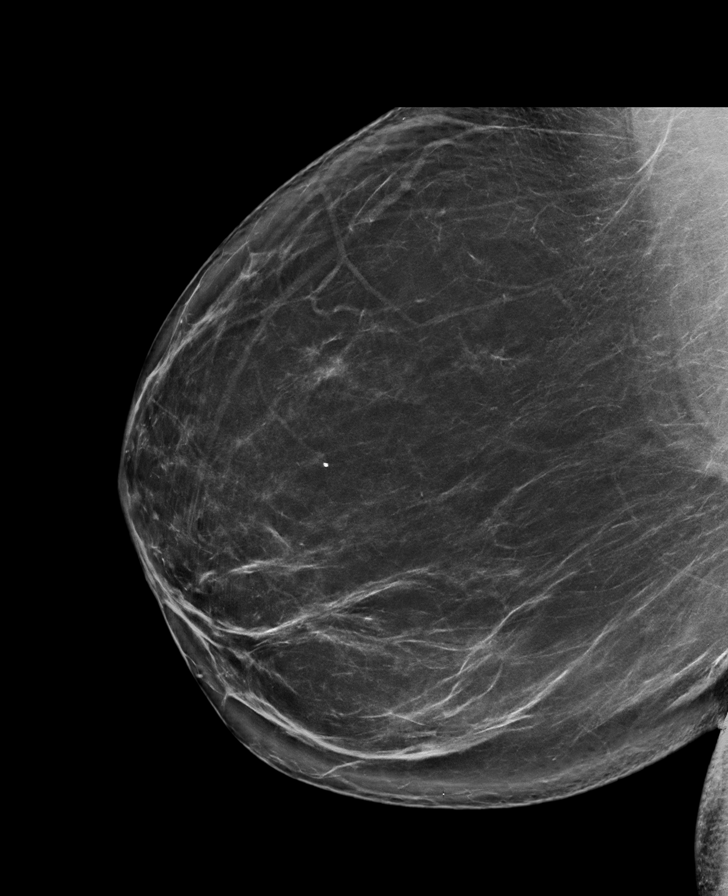

[L CC]
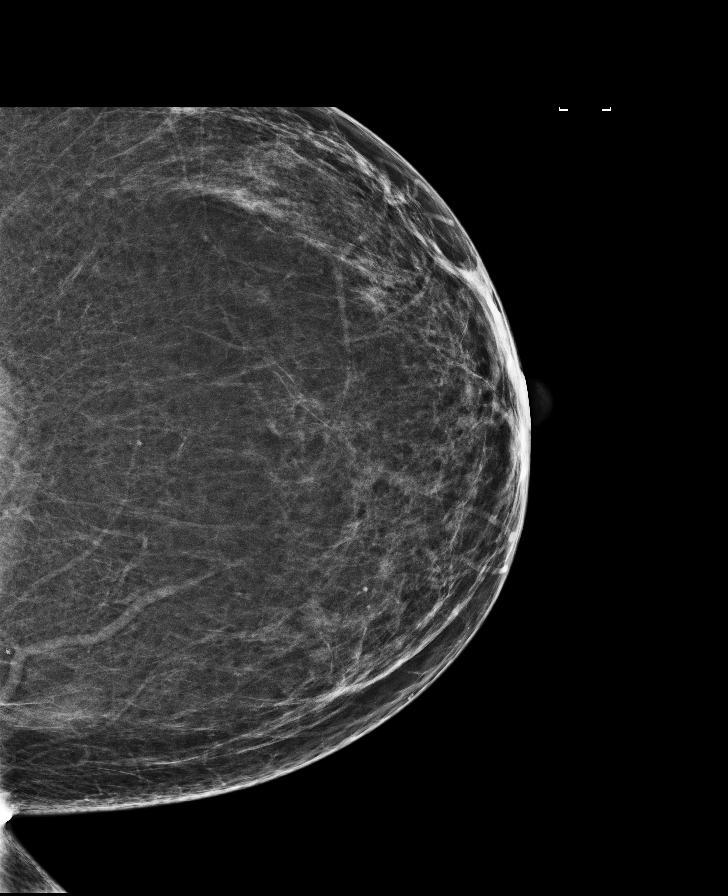

[R CC synth-2D]
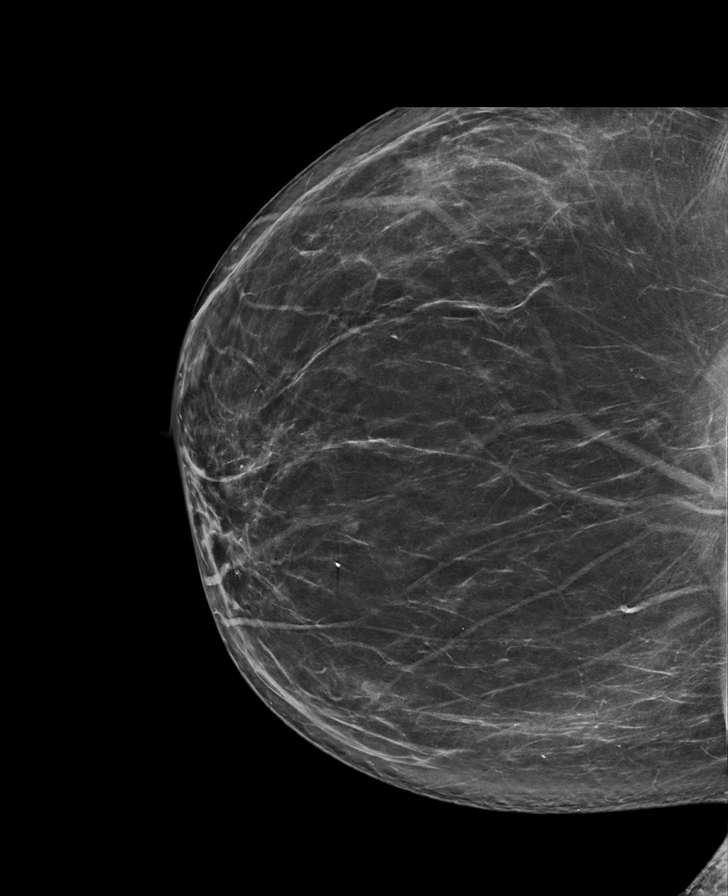

[L MLO]
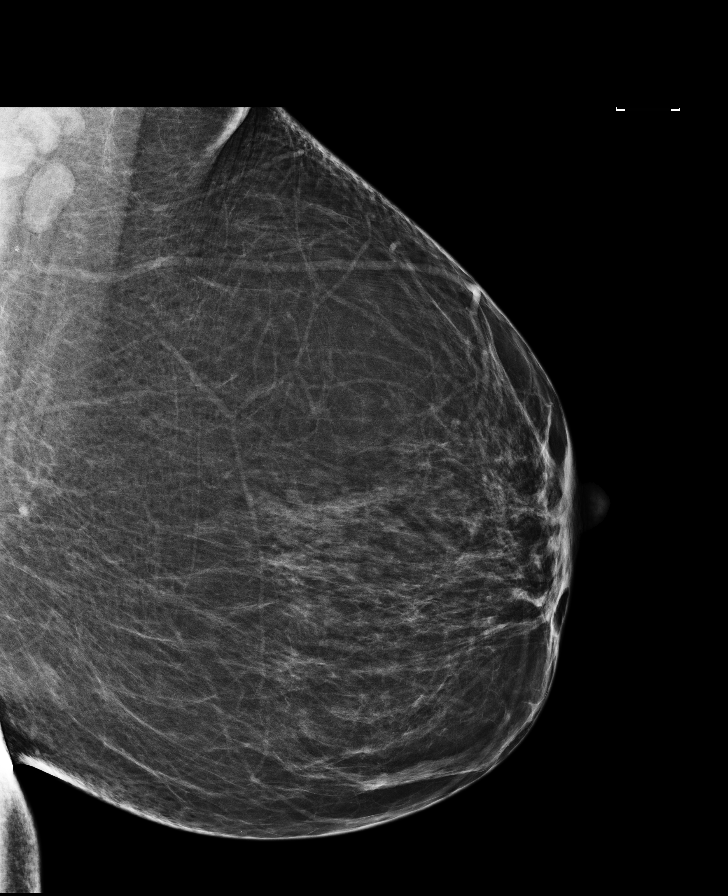

[L CC synth-2D]
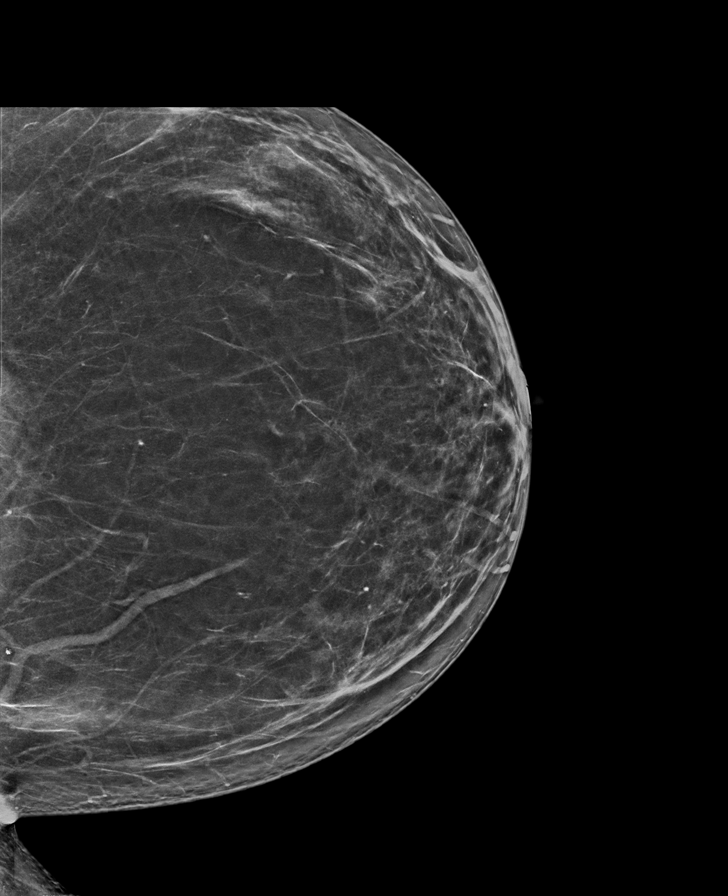

[R CC]
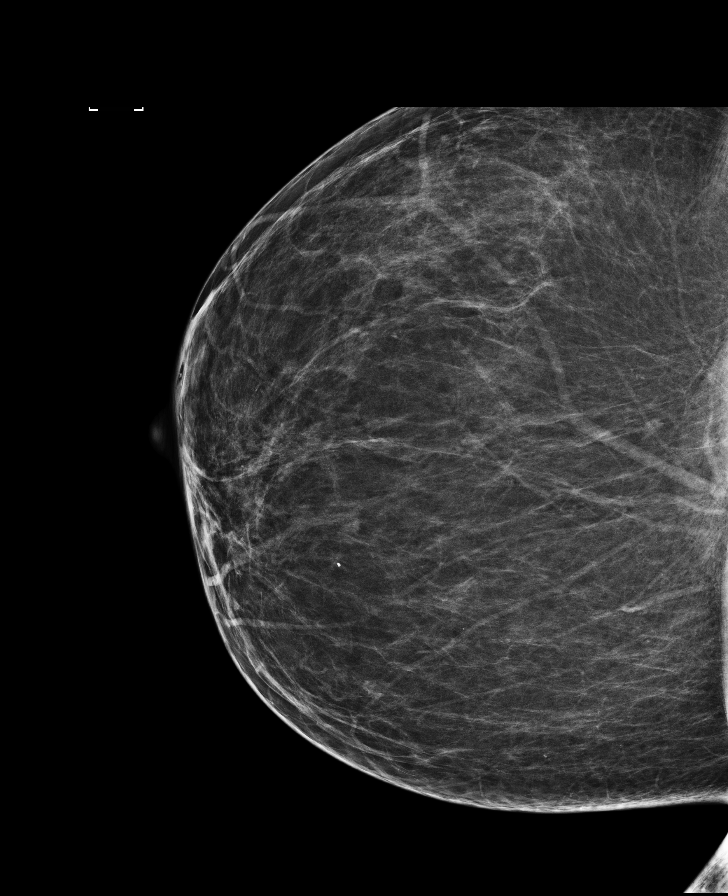

[8 of 28 positions shown; findings below may reference images not displayed]

ACR Breast Density Category b: There are scattered areas of
fibroglandular density.
FINDINGS: There are no findings suspicious for malignancy. Images were
processed with CAD.
IMPRESSION: No mammographic evidence of malignancy. A result letter of this
screening mammogram will be mailed directly to the patient.

RECOMMENDATION:
Screening mammogram in one year. (Code:97-6-RS4)

BI-RADS CATEGORY  1: Negative.

## 2018-08-03 ENCOUNTER — Other Ambulatory Visit: Payer: Self-pay | Admitting: Family

## 2018-08-03 DIAGNOSIS — Z1231 Encounter for screening mammogram for malignant neoplasm of breast: Secondary | ICD-10-CM

## 2018-09-25 ENCOUNTER — Other Ambulatory Visit: Payer: Self-pay

## 2018-09-25 ENCOUNTER — Ambulatory Visit
Admission: RE | Admit: 2018-09-25 | Discharge: 2018-09-25 | Disposition: A | Discharge status: Home / Self Care | Payer: BC Managed Care – PPO | Source: Ambulatory Visit | Attending: Family | Admitting: Family

## 2018-09-25 DIAGNOSIS — Z1231 Encounter for screening mammogram for malignant neoplasm of breast: Secondary | ICD-10-CM | POA: Insufficient documentation

## 2019-02-26 ENCOUNTER — Ambulatory Visit: Payer: BC Managed Care – PPO | Admitting: Podiatry

## 2019-03-02 ENCOUNTER — Ambulatory Visit: Payer: BC Managed Care – PPO | Admitting: Podiatry

## 2019-03-02 ENCOUNTER — Ambulatory Visit (INDEPENDENT_AMBULATORY_CARE_PROVIDER_SITE_OTHER): Payer: BC Managed Care – PPO

## 2019-03-02 ENCOUNTER — Other Ambulatory Visit: Payer: Self-pay

## 2019-03-02 ENCOUNTER — Encounter: Payer: Self-pay | Admitting: Podiatry

## 2019-03-02 ENCOUNTER — Other Ambulatory Visit: Payer: Self-pay | Admitting: Podiatry

## 2019-03-02 DIAGNOSIS — M205X2 Other deformities of toe(s) (acquired), left foot: Secondary | ICD-10-CM

## 2019-03-02 DIAGNOSIS — M722 Plantar fascial fibromatosis: Secondary | ICD-10-CM

## 2019-03-02 MED ORDER — MELOXICAM 15 MG PO TABS: 15.0000 mg | ORAL_TABLET | Freq: Every day | ORAL | 1 refills | Status: DC

## 2019-03-04 NOTE — Progress Notes (Signed)
   HPI: 54 y.o. female presenting today as a new patient with a chief complaint of intermittent stinging, burning pain to the 1st MPJ of the left foot that has been ongoing for the past few years. She reports associated swelling of the foot and ankle. Wearing shoes increases the pain. She has been taking Aleve and Ibuprofen for treatment. Patient is here for further evaluation and treatment.   Past Medical History:  Diagnosis Date  . BV (bacterial vaginosis)   . Constipation   . Dysmenorrhea   . Fibroids   . Hemorrhoid   . Hidradenitis suppurativa      Physical Exam: General: The patient is alert and oriented x3 in no acute distress.  Dermatology: Skin is warm, dry and supple bilateral lower extremities. Negative for open lesions or macerations.  Vascular: Palpable pedal pulses bilaterally. No edema or erythema noted. Capillary refill within normal limits.  Neurological: Epicritic and protective threshold grossly intact bilaterally.   Musculoskeletal Exam: Pain on palpation with limited range of motion noted to the first MPJ left foot.  Radiographic Exam: Degenerative changes noted with joint space narrowing first MPJ. There also appears to be extra-articular spurring noted about the joint.    Assessment: 1. Hallux limitus / DJD left - minimally symptomatic    Plan of Care:  1. Patient evaluated. X-Rays reviewed.  2. Prescription for Meloxicam provided to patient. 3. Recommended good shoe gear.  4. Return to clinic as needed.       Edrick Kins, DPM [facility]  Dr. Edrick Kins, DPM    [address], [place] [address]                Office [phone]  Fax [phone]

## 2019-03-20 ENCOUNTER — Ambulatory Visit: Payer: BC Managed Care – PPO | Admitting: Podiatry

## 2019-05-07 ENCOUNTER — Other Ambulatory Visit: Payer: Self-pay | Admitting: Podiatry

## 2019-05-08 NOTE — Telephone Encounter (Signed)
Rx Meloxicam 15mg  sent to [facility]

## 2019-07-11 ENCOUNTER — Other Ambulatory Visit: Payer: Self-pay | Admitting: Podiatry

## 2019-08-13 ENCOUNTER — Other Ambulatory Visit: Payer: Self-pay | Admitting: Family

## 2019-08-13 DIAGNOSIS — Z1231 Encounter for screening mammogram for malignant neoplasm of breast: Secondary | ICD-10-CM

## 2019-08-28 ENCOUNTER — Encounter: Payer: Self-pay | Admitting: Podiatry

## 2019-08-28 ENCOUNTER — Ambulatory Visit: Payer: BC Managed Care – PPO | Admitting: Podiatry

## 2019-08-28 ENCOUNTER — Other Ambulatory Visit: Payer: Self-pay

## 2019-08-28 DIAGNOSIS — M7752 Other enthesopathy of left foot: Secondary | ICD-10-CM

## 2019-08-28 DIAGNOSIS — M205X2 Other deformities of toe(s) (acquired), left foot: Secondary | ICD-10-CM | POA: Diagnosis not present

## 2019-08-29 NOTE — Progress Notes (Signed)
   HPI: 54 y.o. female presenting today for follow-up treatment and evaluation regarding left great toe pain.  Patient was last seen in the office on 03/02/2019 and she states that she had minimal symptoms to the left great toe joint.  She was diagnosed with hallux limitus.  Patient states over the past 6 months she has had an increase in pain.  She denies any new injury but has noticed some swelling and discomfort to the area.  She presents for further treatment and evaluation  Past Medical History:  Diagnosis Date  . BV (bacterial vaginosis)   . Constipation   . Dysmenorrhea   . Fibroids   . Hemorrhoid   . Hidradenitis suppurativa      Physical Exam: General: The patient is alert and oriented x3 in no acute distress.  Dermatology: Skin is warm, dry and supple bilateral lower extremities. Negative for open lesions or macerations.  Vascular: Palpable pedal pulses bilaterally. No edema or erythema noted. Capillary refill within normal limits.  Neurological: Epicritic and protective threshold grossly intact bilaterally.   Musculoskeletal Exam: Pain on palpation with limited range of motion noted to the first MPJ left foot.  Radiographic Exam taken on 03/02/2019 indicate Degenerative changes noted with joint space narrowing first MPJ. There also appears to be extra-articular spurring noted about the joint.    Assessment: 1. Hallux limitus / DJD left  Plan of Care:  1. Patient evaluated. X-Rays reviewed reviewed again today that were taken previously.  2.  Patient is not taking meloxicam that was prescribed last visit because she states it did not help.  Patient states that Motrin 800 mg offers more relief.  Continue Motrin 800 mg as needed 3.  Recommend good supportive shoes with a stiff sole to help alleviate range of motion and pressure from the great toe joint 4.  I explained to the patient that eventually she will likely need surgical intervention if the arthritis progresses to where  it is no longer tolerable.  Patient states she has not reached that point yet.  At that point she will return to the clinic for surgical consult.     Edrick Kins, DPM [facility]  Dr. Edrick Kins, DPM    [address], [place] [address]                Office [phone]  Fax [phone]

## 2019-09-26 ENCOUNTER — Ambulatory Visit: Payer: BC Managed Care – PPO

## 2019-10-01 ENCOUNTER — Ambulatory Visit
Admission: RE | Admit: 2019-10-01 | Discharge: 2019-10-01 | Disposition: A | Discharge status: Home / Self Care | Payer: BC Managed Care – PPO | Source: Ambulatory Visit | Attending: Family | Admitting: Family

## 2019-10-01 ENCOUNTER — Other Ambulatory Visit: Payer: Self-pay

## 2019-10-01 DIAGNOSIS — Z1231 Encounter for screening mammogram for malignant neoplasm of breast: Secondary | ICD-10-CM | POA: Insufficient documentation

## 2020-09-02 ENCOUNTER — Other Ambulatory Visit: Payer: Self-pay | Admitting: Family

## 2020-09-02 DIAGNOSIS — Z1231 Encounter for screening mammogram for malignant neoplasm of breast: Secondary | ICD-10-CM

## 2020-10-06 ENCOUNTER — Ambulatory Visit
Admission: RE | Admit: 2020-10-06 | Discharge: 2020-10-06 | Disposition: A | Discharge status: Home / Self Care | Payer: BC Managed Care – PPO | Source: Ambulatory Visit | Attending: Family | Admitting: Family

## 2020-10-06 ENCOUNTER — Other Ambulatory Visit: Payer: Self-pay

## 2020-10-06 DIAGNOSIS — Z1231 Encounter for screening mammogram for malignant neoplasm of breast: Secondary | ICD-10-CM | POA: Insufficient documentation

## 2020-11-11 ENCOUNTER — Ambulatory Visit: Payer: BC Managed Care – PPO

## 2020-11-26 ENCOUNTER — Ambulatory Visit
Admission: RE | Admit: 2020-11-26 | Discharge: 2020-11-26 | Disposition: A | Discharge status: Home / Self Care | Payer: BC Managed Care – PPO | Source: Ambulatory Visit | Attending: Family | Admitting: Family

## 2020-11-26 ENCOUNTER — Other Ambulatory Visit: Payer: Self-pay

## 2020-11-26 DIAGNOSIS — Z1231 Encounter for screening mammogram for malignant neoplasm of breast: Secondary | ICD-10-CM | POA: Diagnosis not present

## 2021-11-11 ENCOUNTER — Other Ambulatory Visit: Payer: Self-pay | Admitting: Family

## 2021-11-11 DIAGNOSIS — Z1231 Encounter for screening mammogram for malignant neoplasm of breast: Secondary | ICD-10-CM

## 2021-12-03 ENCOUNTER — Ambulatory Visit
Admission: RE | Admit: 2021-12-03 | Discharge: 2021-12-03 | Disposition: A | Discharge status: Home / Self Care | Payer: BC Managed Care – PPO | Source: Ambulatory Visit | Attending: Family | Admitting: Family

## 2021-12-03 DIAGNOSIS — Z1231 Encounter for screening mammogram for malignant neoplasm of breast: Secondary | ICD-10-CM | POA: Diagnosis present
